# Patient Record
Sex: Male | Born: 1995 | Race: Black or African American | Hispanic: No | Marital: Single | Smoking: Current every day smoker
Health system: Southern US, Community
[De-identification: ages and names within clinical notes are randomized; demographics above are authoritative.]

## PROBLEM LIST (undated history)

## (undated) ENCOUNTER — Ambulatory Visit: Admission: EM | Payer: PRIVATE HEALTH INSURANCE | Source: Home / Self Care

## (undated) HISTORY — PX: TOE SURGERY: SHX1073

---
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
## 2014-11-07 ENCOUNTER — Emergency Department (HOSPITAL_COMMUNITY)
Admission: EM | Admit: 2014-11-07 | Discharge: 2014-11-07 | Discharge status: Absent without leave | Payer: Self-pay | Source: Home / Self Care

## 2014-11-09 ENCOUNTER — Emergency Department: Payer: Self-pay | Admitting: Emergency Medicine
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
## 2018-07-11 DIAGNOSIS — J309 Allergic rhinitis, unspecified: Secondary | ICD-10-CM | POA: Diagnosis not present

## 2018-07-11 DIAGNOSIS — J029 Acute pharyngitis, unspecified: Secondary | ICD-10-CM | POA: Diagnosis not present

## 2019-04-13 DIAGNOSIS — U071 COVID-19: Secondary | ICD-10-CM | POA: Diagnosis not present

## 2019-04-16 DIAGNOSIS — U071 COVID-19: Secondary | ICD-10-CM | POA: Diagnosis not present

## 2019-04-20 DIAGNOSIS — Z03818 Encounter for observation for suspected exposure to other biological agents ruled out: Secondary | ICD-10-CM | POA: Diagnosis not present

## 2019-06-01 DIAGNOSIS — Z03818 Encounter for observation for suspected exposure to other biological agents ruled out: Secondary | ICD-10-CM | POA: Diagnosis not present

## 2019-06-01 DIAGNOSIS — Z20828 Contact with and (suspected) exposure to other viral communicable diseases: Secondary | ICD-10-CM | POA: Diagnosis not present

## 2019-06-08 DIAGNOSIS — Z20828 Contact with and (suspected) exposure to other viral communicable diseases: Secondary | ICD-10-CM | POA: Diagnosis not present

## 2019-12-30 ENCOUNTER — Ambulatory Visit (INDEPENDENT_AMBULATORY_CARE_PROVIDER_SITE_OTHER): Payer: Federal, State, Local not specified - PPO

## 2019-12-30 ENCOUNTER — Ambulatory Visit (HOSPITAL_COMMUNITY)
Admission: EM | Admit: 2019-12-30 | Discharge: 2019-12-30 | Disposition: A | Discharge status: Home / Self Care | Payer: Federal, State, Local not specified - PPO | Attending: Family Medicine | Admitting: Family Medicine

## 2019-12-30 ENCOUNTER — Other Ambulatory Visit: Payer: Self-pay

## 2019-12-30 ENCOUNTER — Encounter (HOSPITAL_COMMUNITY): Payer: Self-pay

## 2019-12-30 DIAGNOSIS — M20011 Mallet finger of right finger(s): Secondary | ICD-10-CM

## 2019-12-30 DIAGNOSIS — M79641 Pain in right hand: Secondary | ICD-10-CM | POA: Diagnosis not present

## 2019-12-30 MED ORDER — IBUPROFEN 800 MG PO TABS: 800.0000 mg | ORAL_TABLET | Freq: Three times a day (TID) | ORAL | 0 refills | Status: DC
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
## 2020-02-02 ENCOUNTER — Ambulatory Visit
Admission: EM | Admit: 2020-02-02 | Discharge: 2020-02-02 | Disposition: A | Discharge status: Home / Self Care | Payer: Federal, State, Local not specified - PPO | Attending: Emergency Medicine | Admitting: Emergency Medicine

## 2020-02-02 ENCOUNTER — Other Ambulatory Visit: Payer: Self-pay

## 2020-02-02 ENCOUNTER — Encounter: Payer: Self-pay | Admitting: Emergency Medicine

## 2020-02-02 DIAGNOSIS — K602 Anal fissure, unspecified: Secondary | ICD-10-CM | POA: Diagnosis not present

## 2020-02-02 DIAGNOSIS — L290 Pruritus ani: Secondary | ICD-10-CM

## 2020-02-02 MED ORDER — ALBENDAZOLE 200 MG PO TABS: 400.0000 mg | ORAL_TABLET | Freq: Once | ORAL | 0 refills | Status: AC

## 2020-02-02 NOTE — ED Provider Notes (Signed)
[facility]    CSN: [id number] Arrival date & time: 02/02/20  1552      History   Chief Complaint Chief Complaint  Patient presents with  . Anal Itching    HPI Steven Arroyo is a 24 y.o. male presenting for weeklong course of perianal itching.  Patient states he noticed a little cut a few days ago, though believes it is healing.  Denies warts, history of trauma to area.  Patient is currently sexually active with more than one male partner, not routinely using condoms.  Denies penile or testicular pain or swelling, penile discharge, abdominal pain, back pain, fever.  States itching is more noticeable at night and when walking.  Denies history of pinworms or observation of foreign body in stool, diarrhea, hematochezia or melena.   History reviewed. No pertinent past medical history.  There are no problems to display for this patient.   History reviewed. No pertinent surgical history.     Home Medications    Prior to Admission medications   Medication Sig Start Date End Date Taking? Authorizing Provider  albendazole (ALBENZA) 200 MG tablet Take 2 tablets (400 mg total) by mouth once for 1 dose. Repeat 2 tab dose in 2 weeks 02/02/20 02/02/20  Hall-Potvin, [place], PA-C  ibuprofen (ADVIL) 800 MG tablet Take 1 tablet (800 mg total) by mouth 3 (three) times daily with meals. 12/30/19   Mardella Layman, MD    Family History Family History  Problem Relation Age of Onset  . Healthy Mother     Social History Social History   Tobacco Use  . Smoking status: Never Smoker  . Smokeless tobacco: Never Used  Substance Use Topics  . Alcohol use: Yes  . Drug use: Yes    Types: Marijuana     Allergies   Patient has no known allergies.   Review of Systems As per HPI   Physical Exam Triage Vital Signs ED Triage Vitals  Enc Vitals Group     BP      Pulse      Resp      Temp      Temp src      SpO2      Weight      Height      Head Circumference    Peak Flow      Pain Score      Pain Loc      Pain Edu?      Excl. in GC?    No data found.  Updated Vital Signs BP 104/65 (BP Location: Left Arm)   Pulse 70   Temp 98.3 F (36.8 C) (Oral)   Resp 18   SpO2 96%   Visual Acuity Right Eye Distance:   Left Eye Distance:   Bilateral Distance:    Right Eye Near:   Left Eye Near:    Bilateral Near:     Physical Exam Constitutional:      General: He is not in acute distress. HENT:     Head: Normocephalic and atraumatic.  Eyes:     General: No scleral icterus.    Pupils: Pupils are equal, round, and reactive to light.  Cardiovascular:     Rate and Rhythm: Normal rate.  Pulmonary:     Effort: Pulmonary effort is normal. No respiratory distress.     Breath sounds: No wheezing.  Genitourinary:    Comments: Perianal fissure noted: Superficial and without tenderness.  No eggs, larvae noted.  Internal rectal  exam deferred Skin:    Coloration: Skin is not jaundiced or pale.  Neurological:     Mental Status: He is alert and oriented to person, place, and time.      UC Treatments / Results  Labs (all labs ordered are listed, but only abnormal results are displayed) Labs Reviewed - No data to display  EKG   Radiology No results found.  Procedures Procedures (including critical care time)  Medications Ordered in UC Medications - No data to display  Initial Impression / Assessment and Plan / UC Course  I have reviewed the triage vital signs and the nursing notes.  Pertinent labs & imaging results that were available during my care of the patient were reviewed by me and considered in my medical decision making (see chart for details).     No hemorrhoids noted on exam.  Does have mild, superficial fissure.  Reviewed supportive care as outlined below.  Given nocturnal pruritus will cover for pinworms with albendazole.  Return precautions discussed, patient verbalized understanding and is agreeable to plan. Final  Clinical Impressions(s) / UC Diagnoses   Final diagnoses:  Anal fissure  Anal pruritus     Discharge Instructions     Reported to have 1 soft bowel movement daily: May take a capful of MiraLAX once daily x1 week if needed. Do sitz bath as discussed: A few inches of warm water without anything in it to help relax anus. Take 2 tabs of albendazole once today, the other 2 tabs in 2 weeks, this will treat for pinworms. Return for worsening itching, change in stool, fever.    ED Prescriptions    Medication Sig Dispense Auth. Provider   albendazole (ALBENZA) 200 MG tablet Take 2 tablets (400 mg total) by mouth once for 1 dose. Repeat 2 tab dose in 2 weeks 4 tablet Hall-Potvin, [place], PA-C     PDMP not reviewed this encounter.   Hall-Potvin, [place], [place] 02/02/20 1657

## 2020-02-02 NOTE — ED Triage Notes (Signed)
Pt sts 1 week of rectal itching with possible lesion per pt

## 2020-02-02 NOTE — Discharge Instructions (Signed)
Reported to have 1 soft bowel movement daily: May take a capful of MiraLAX once daily x1 week if needed. Do sitz bath as discussed: A few inches of warm water without anything in it to help relax anus. Take 2 tabs of albendazole once today, the other 2 tabs in 2 weeks, this will treat for pinworms. Return for worsening itching, change in stool, fever.

## 2020-03-28 DIAGNOSIS — K011 Impacted teeth: Secondary | ICD-10-CM | POA: Diagnosis not present

## 2020-04-19 DIAGNOSIS — J020 Streptococcal pharyngitis: Secondary | ICD-10-CM | POA: Diagnosis not present

## 2020-05-05 ENCOUNTER — Encounter: Payer: Self-pay | Admitting: Medical

## 2020-05-05 ENCOUNTER — Ambulatory Visit: Payer: Federal, State, Local not specified - PPO | Admitting: Medical

## 2020-05-05 VITALS — BP 102/68 | HR 78 | Ht 69.5 in | Wt 135.6 lb

## 2020-05-05 DIAGNOSIS — Z13220 Encounter for screening for lipoid disorders: Secondary | ICD-10-CM | POA: Diagnosis not present

## 2020-05-05 DIAGNOSIS — Z1329 Encounter for screening for other suspected endocrine disorder: Secondary | ICD-10-CM | POA: Insufficient documentation

## 2020-05-05 DIAGNOSIS — S62639A Displaced fracture of distal phalanx of unspecified finger, initial encounter for closed fracture: Secondary | ICD-10-CM

## 2020-05-05 DIAGNOSIS — Z0000 Encounter for general adult medical examination without abnormal findings: Secondary | ICD-10-CM | POA: Diagnosis not present

## 2020-05-05 DIAGNOSIS — Z23 Encounter for immunization: Secondary | ICD-10-CM

## 2020-05-05 DIAGNOSIS — F17200 Nicotine dependence, unspecified, uncomplicated: Secondary | ICD-10-CM | POA: Diagnosis not present

## 2020-05-05 DIAGNOSIS — Z113 Encounter for screening for infections with a predominantly sexual mode of transmission: Secondary | ICD-10-CM
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
## 2020-05-06 LAB — COMPREHENSIVE METABOLIC PANEL
ALT: 14 IU/L (ref 0–44)
AST: 27 IU/L (ref 0–40)
Albumin/Globulin Ratio: 1.4 (ref 1.2–2.2)
Albumin: 4.4 g/dL (ref 4.1–5.2)
Alkaline Phosphatase: 69 IU/L (ref 48–121)
BUN/Creatinine Ratio: 10 (ref 9–20)
BUN: 9 mg/dL (ref 6–20)
Bilirubin Total: 0.5 mg/dL (ref 0.0–1.2)
CO2: 23 mmol/L (ref 20–29)
Calcium: 9.6 mg/dL (ref 8.7–10.2)
Chloride: 103 mmol/L (ref 96–106)
Creatinine, Ser: 0.89 mg/dL (ref 0.76–1.27)
GFR calc Af Amer: 137 mL/min/{1.73_m2} (ref >59)
GFR calc non Af Amer: 119 mL/min/{1.73_m2} (ref >59)
Globulin, Total: 3.1 g/dL (ref 1.5–4.5)
Glucose: 69 mg/dL (ref 65–99)
Potassium: 4.7 mmol/L (ref 3.5–5.2)
Sodium: 141 mmol/L (ref 134–144)
Total Protein: 7.5 g/dL (ref 6.0–8.5)

## 2020-05-06 LAB — CBC WITH DIFFERENTIAL/PLATELET
Basophils Absolute: 0.1 10*3/uL (ref 0.0–0.2)
Basos: 1 %
EOS (ABSOLUTE): 0.1 10*3/uL (ref 0.0–0.4)
Eos: 3 %
Hematocrit: 41 % (ref 37.5–51.0)
Hemoglobin: 14.5 g/dL (ref 13.0–17.7)
Immature Grans (Abs): 0 10*3/uL (ref 0.0–0.1)
Immature Granulocytes: 0 %
Lymphocytes Absolute: 1.7 10*3/uL (ref 0.7–3.1)
Lymphs: 43 %
MCH: 32.4 pg (ref 26.6–33.0)
MCHC: 35.4 g/dL (ref 31.5–35.7)
MCV: 92 fL (ref 79–97)
Monocytes Absolute: 0.2 10*3/uL (ref 0.1–0.9)
Monocytes: 6 %
Neutrophils Absolute: 1.8 10*3/uL (ref 1.4–7.0)
Neutrophils: 47 %
Platelets: 228 10*3/uL (ref 150–450)
RBC: 4.48 x10E6/uL (ref 4.14–5.80)
RDW: 12.4 % (ref 11.6–15.4)
WBC: 3.9 10*3/uL (ref 3.4–10.8)

## 2020-05-06 LAB — HIV ANTIBODY (ROUTINE TESTING W REFLEX): HIV Screen 4th Generation wRfx: NONREACTIVE

## 2020-05-06 LAB — GC/CHLAMYDIA PROBE AMP
Chlamydia trachomatis, NAA: NEGATIVE
Neisseria Gonorrhoeae by PCR: NEGATIVE

## 2020-05-06 LAB — LIPID PANEL
Chol/HDL Ratio: 2.4 ratio (ref 0.0–5.0)
Cholesterol, Total: 136 mg/dL (ref 100–199)
HDL: 56 mg/dL (ref >39)
LDL Chol Calc (NIH): 62 mg/dL (ref 0–99)
Triglycerides: 95 mg/dL (ref 0–149)
VLDL Cholesterol Cal: 18 mg/dL (ref 5–40)

## 2020-05-06 LAB — T4, FREE: Free T4: 1.25 ng/dL (ref 0.82–1.77)

## 2020-05-06 LAB — RPR: RPR Ser Ql: NONREACTIVE

## 2020-05-06 LAB — TSH: TSH: 1.9 u[IU]/mL (ref 0.450–4.500)

## 2020-05-18 DIAGNOSIS — S62636D Displaced fracture of distal phalanx of right little finger, subsequent encounter for fracture with routine healing: Secondary | ICD-10-CM | POA: Diagnosis not present

## 2020-05-18 DIAGNOSIS — S62639D Displaced fracture of distal phalanx of unspecified finger, subsequent encounter for fracture with routine healing: Secondary | ICD-10-CM | POA: Diagnosis not present

## 2020-05-18 DIAGNOSIS — R29898 Other symptoms and signs involving the musculoskeletal system: Secondary | ICD-10-CM | POA: Diagnosis not present

## 2020-05-18 DIAGNOSIS — M20019 Mallet finger of unspecified finger(s): Secondary | ICD-10-CM | POA: Diagnosis not present

## 2020-05-18 DIAGNOSIS — M79644 Pain in right finger(s): Secondary | ICD-10-CM | POA: Diagnosis not present

## 2020-05-18 DIAGNOSIS — M20011 Mallet finger of right finger(s): Secondary | ICD-10-CM | POA: Diagnosis not present

## 2020-05-18 DIAGNOSIS — S62639A Displaced fracture of distal phalanx of unspecified finger, initial encounter for closed fracture: Secondary | ICD-10-CM | POA: Diagnosis not present

## 2020-05-18 DIAGNOSIS — M25641 Stiffness of right hand, not elsewhere classified: Secondary | ICD-10-CM | POA: Diagnosis not present
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
## 2022-07-13 ENCOUNTER — Emergency Department (HOSPITAL_COMMUNITY)
Admission: EM | Admit: 2022-07-13 | Discharge: 2022-07-13 | Disposition: A | Discharge status: Home / Self Care | Payer: No Typology Code available for payment source | Attending: Emergency Medicine | Admitting: Emergency Medicine

## 2022-07-13 ENCOUNTER — Encounter (HOSPITAL_COMMUNITY): Payer: Self-pay

## 2022-07-13 ENCOUNTER — Other Ambulatory Visit: Payer: Self-pay

## 2022-07-13 DIAGNOSIS — W540XXA Bitten by dog, initial encounter: Secondary | ICD-10-CM | POA: Insufficient documentation

## 2022-07-13 DIAGNOSIS — S71152A Open bite, left thigh, initial encounter: Secondary | ICD-10-CM | POA: Diagnosis present

## 2022-07-13 DIAGNOSIS — Z23 Encounter for immunization: Secondary | ICD-10-CM | POA: Insufficient documentation

## 2022-07-13 MED ORDER — AMOXICILLIN-POT CLAVULANATE 875-125 MG PO TABS
1.0000 | ORAL_TABLET | Freq: Once | ORAL | Status: AC
  Administered 2022-07-13: 1 via ORAL
  Filled 2022-07-13: qty 1

## 2022-07-13 MED ORDER — AMOXICILLIN-POT CLAVULANATE 875-125 MG PO TABS: 1.0000 | ORAL_TABLET | Freq: Two times a day (BID) | ORAL | 0 refills | Status: AC

## 2022-07-13 MED ORDER — LIDOCAINE-EPINEPHRINE (PF) 2 %-1:200000 IJ SOLN
10.0000 mL | Freq: Once | INTRAMUSCULAR | Status: AC
  Administered 2022-07-13: 10 mL
  Filled 2022-07-13: qty 20

## 2022-07-13 MED ORDER — IBUPROFEN 600 MG PO TABS: 600.0000 mg | ORAL_TABLET | Freq: Four times a day (QID) | ORAL | 0 refills | Status: AC | PRN

## 2022-07-13 MED ORDER — TETANUS-DIPHTH-ACELL PERTUSSIS 5-2.5-18.5 LF-MCG/0.5 IM SUSY
0.5000 mL | PREFILLED_SYRINGE | Freq: Once | INTRAMUSCULAR | Status: AC
  Administered 2022-07-13: 0.5 mL via INTRAMUSCULAR
  Filled 2022-07-13: qty 0.5

## 2022-07-13 NOTE — ED Triage Notes (Addendum)
Pt reports he was bitten by a dog today. Deep unapproximated macerated laceration to left upper lateral thigh. Scant bleeding controlled. He reports animal control been notified and reports the dog is UTD on vaccines.

## 2022-07-13 NOTE — ED Notes (Signed)
Wound dressed in non-stick gauze. Antibiotic ointment applied. No questions with DC paperwork.

## 2022-07-13 NOTE — ED Provider Triage Note (Signed)
Emergency Medicine Provider Triage Evaluation Note  Steven Arroyo , a 26 y.o. male  was evaluated in triage.  Pt complains of dog bite to the left leg while at work. Dog is unknown to the patient, but animal control was already contacted and started investigation. Pt was updated that dog is up to date on all vaccinations.   Review of Systems  Positive: Dog bite Negative:    Physical Exam  BP (!) 141/86   Pulse 70   Temp 98.5 F (36.9 C) (Oral)   Resp 16   SpO2 100%  Gen:   Awake, no distress   Resp:  Normal effort  MSK:   Moves extremities without difficulty  Other:  Deep laceration approximately 1.5-2 cm on left posterior thigh  Medical Decision Making  Medically screening exam initiated at 4:57 PM.  Appropriate orders placed.  Steven Arroyo was informed that the remainder of the evaluation will be completed by another provider, this initial triage assessment does not replace that evaluation, and the importance of remaining in the ED until their evaluation is complete.     Steven Monks, PA-C 07/13/22 1658

## 2022-07-13 NOTE — ED Provider Notes (Signed)
[facility] EMERGENCY DEPARTMENT Provider Note   CSN: [id number] Arrival date & time: 07/13/22  1628     History  Chief Complaint  Patient presents with   Animal Bite    Steven Arroyo is a 26 y.o. male.  Pt is an 26 yo male with no significant pmhx.  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  The pt was bitten by the house owner's [employer] on his left upper leg.  Animal control did come out and verify the dog was utd on its shots.  Pt is not utd on his tetanus.       Home Medications Prior to Admission medications   Medication Sig Start Date End Date Taking? Authorizing Provider  amoxicillin-clavulanate (AUGMENTIN) 875-125 MG tablet Take 1 tablet by mouth every 12 (twelve) hours. 07/13/22  Yes Jacalyn Lefevre, MD  ibuprofen (ADVIL) 600 MG tablet Take 1 tablet (600 mg total) by mouth every 6 (six) hours as needed. 07/13/22  Yes Jacalyn Lefevre, MD  penicillin v potassium (VEETID) 500 MG tablet Take 500 mg by mouth 2 (two) times daily. 04/19/20   [provider]      Allergies    Patient has no known allergies.    Review of Systems   Review of Systems  Skin:  Positive for wound.    Physical Exam Updated Vital Signs BP 121/73   Pulse 64   Temp 98.5 F (36.9 C) (Oral)   Resp 14   Ht 5\' 10"  (1.778 m)   Wt 61.2 kg   SpO2 99%   BMI 19.37 kg/m  Physical Exam Vitals and nursing note reviewed.  Constitutional:      Appearance: Normal appearance.  HENT:     Head: Normocephalic and atraumatic.     Right Ear: External ear normal.     Left Ear: External ear normal.     Nose: Nose normal.     Mouth/Throat:     Mouth: Mucous membranes are moist.     Pharynx: Oropharynx is clear.  Eyes:     Extraocular Movements: Extraocular movements intact.     Conjunctiva/sclera: Conjunctivae normal.     Pupils: Pupils are equal, round, and reactive to light.  Cardiovascular:     Rate and Rhythm: Normal rate and regular  rhythm.     Pulses: Normal pulses.     Heart sounds: Normal heart sounds.  Pulmonary:     Effort: Pulmonary effort is normal.     Breath sounds: Normal breath sounds.  Abdominal:     General: Abdomen is flat. Bowel sounds are normal.     Palpations: Abdomen is soft.  Musculoskeletal:        General: Normal range of motion.     Cervical back: Normal range of motion and neck supple.  Skin:    General: Skin is warm.     Capillary Refill: Capillary refill takes less than 2 seconds.     Comments: 3 cm lac to left lateral thigh  Neurological:     General: No focal deficit present.     Mental Status: He is alert and oriented to person, place, and time.  Psychiatric:        Mood and Affect: Mood normal.        Behavior: Behavior normal.     ED Results / Procedures / Treatments   Labs (all labs ordered are listed, but only abnormal results are displayed) Labs Reviewed -  No data to display  EKG None  Radiology No results found.  Procedures .Marland KitchenLaceration Repair  Date/Time: 07/13/2022 11:34 PM  Performed by: Jacalyn Lefevre, MD Authorized by: Jacalyn Lefevre, MD   Consent:    Consent obtained:  Verbal   Consent given by:  Patient   Risks discussed:  Infection and pain Universal protocol:    Patient identity confirmed:  Verbally with patient Anesthesia:    Anesthesia method:  Local infiltration   Local anesthetic:  Lidocaine 2% WITH epi Laceration details:    Location:  Leg   Leg location:  L upper leg   Length (cm):  3 Treatment:    Area cleansed with:  Povidone-iodine   Amount of cleaning:  Extensive   Irrigation solution:  Sterile water   Irrigation volume:  500   Irrigation method:  Pressure wash Skin repair:    Repair method:  Sutures   Suture size:  4-0   Suture material:  Nylon   Suture technique:  Simple interrupted   Number of sutures:  5 Repair type:    Repair type:  Simple Post-procedure details:    Dressing:  Antibiotic ointment   Procedure  completion:  Tolerated well, no immediate complications     Medications Ordered in ED Medications  lidocaine-EPINEPHrine (XYLOCAINE W/EPI) 2 %-1:200000 (PF) injection 10 mL (has no administration in time range)  Tdap (BOOSTRIX) injection 0.5 mL (0.5 mLs Intramuscular Given 07/13/22 1708)  amoxicillin-clavulanate (AUGMENTIN) 875-125 MG per tablet 1 tablet (1 tablet Oral Given 07/13/22 1707)    ED Course/ Medical Decision Making/ A&P                           Medical Decision Making Risk Prescription drug management.   Pt given tetanus immunization.  He is given augmentin.  He is d/c with augmentin.  He is instructed to return for increased redness or swelling.  He is to get stitches removed in 7-10 days.        Final Clinical Impression(s) / ED Diagnoses Final diagnoses:  Dog bite, initial encounter    Rx / DC Orders ED Discharge Orders          Ordered    amoxicillin-clavulanate (AUGMENTIN) 875-125 MG tablet  Every 12 hours        07/13/22 2323    ibuprofen (ADVIL) 600 MG tablet  Every 6 hours PRN        07/13/22 2323              Jacalyn Lefevre, MD 07/13/22 2336
